# Patient Record
Sex: Female | Born: 1960 | Race: White | Hispanic: No | Marital: Single | Smoking: Current every day smoker
Health system: Southern US, Community
[De-identification: ages and names within clinical notes are randomized; demographics above are authoritative.]

## PROBLEM LIST (undated history)

## (undated) DIAGNOSIS — J189 Pneumonia, unspecified organism: Secondary | ICD-10-CM

---

## 2012-12-08 ENCOUNTER — Ambulatory Visit (INDEPENDENT_AMBULATORY_CARE_PROVIDER_SITE_OTHER): Payer: Self-pay | Admitting: Physician Assistant

## 2012-12-08 VITALS — BP 110/80 | HR 82 | Temp 98.1°F | Resp 16 | Ht 70.5 in | Wt 224.0 lb

## 2012-12-08 DIAGNOSIS — F988 Other specified behavioral and emotional disorders with onset usually occurring in childhood and adolescence: Secondary | ICD-10-CM

## 2012-12-08 NOTE — Progress Notes (Signed)
  Subjective:    Patient ID: Deanna Bates, female    DOB: 09-04-1960, 52 y.o.   MRN: [id number]  HPI 52 year old female presents for refill of Adderall 20 mg daily.  States she moved here from [place] 2.5 months ago but has not yet established care here.  Was seeing a psychiatrist and therapist after the death of her son.  Has only been on Adderall for the last 6 months since starting therapy.  States she called him because she was going to be out of her medications, and he sent prescriptions to be refilled but she was unable to get her Adderall because out of state prescriptions "need to be verified."  States she has been out for 1 1/2 weeks.      Review of Systems  Constitutional: Negative for fever.  Respiratory: Negative for chest tightness.   Neurological: Negative for dizziness.       Objective:   Physical Exam  Constitutional: She is oriented to person, place, and time. She appears well-developed and well-nourished.  HENT:  Head: Normocephalic and atraumatic.  Right Ear: External ear normal.  Left Ear: External ear normal.  Eyes: Conjunctivae are normal.  Neck: Normal range of motion.  Cardiovascular: Normal rate.   Pulmonary/Chest: Effort normal.  Neurological: She is alert and oriented to person, place, and time.  Psychiatric: She has a normal mood and affect. Her behavior is normal. Judgment and thought content normal.          Assessment & Plan:  ADD (attention deficit disorder)  Per [place] Controlled Substance Database she had Adderall 20 mg daily #30 filled on 12/02/12.  Spoke with pharmacist who verified this information.  Has also had all of her medication filled at another pharmacy in 09/2012.  Patient reports she did not receive prescription on 12/02/12 and is concerned about theft.  I encouraged her to consult the police and file a report if necessary. In the meantime I explained I would not be able to fill her Adderall at this time, although I would be more than happy  to refer her to a psychiatrist here in town. Patient understands.

## 2013-11-01 ENCOUNTER — Emergency Department (HOSPITAL_COMMUNITY)
Admission: EM | Admit: 2013-11-01 | Discharge: 2013-11-01 | Disposition: A | Discharge status: Home / Self Care | Payer: PRIVATE HEALTH INSURANCE | Attending: Emergency Medicine | Admitting: Emergency Medicine

## 2013-11-01 ENCOUNTER — Encounter (HOSPITAL_COMMUNITY): Payer: Self-pay | Admitting: Emergency Medicine

## 2013-11-01 ENCOUNTER — Emergency Department (HOSPITAL_COMMUNITY): Payer: PRIVATE HEALTH INSURANCE

## 2013-11-01 DIAGNOSIS — F17200 Nicotine dependence, unspecified, uncomplicated: Secondary | ICD-10-CM | POA: Insufficient documentation

## 2013-11-01 DIAGNOSIS — J040 Acute laryngitis: Secondary | ICD-10-CM | POA: Insufficient documentation

## 2013-11-01 DIAGNOSIS — R0602 Shortness of breath: Secondary | ICD-10-CM | POA: Insufficient documentation

## 2013-11-01 DIAGNOSIS — R079 Chest pain, unspecified: Secondary | ICD-10-CM

## 2013-11-01 DIAGNOSIS — Z7982 Long term (current) use of aspirin: Secondary | ICD-10-CM | POA: Insufficient documentation

## 2013-11-01 DIAGNOSIS — Z79899 Other long term (current) drug therapy: Secondary | ICD-10-CM | POA: Insufficient documentation

## 2013-11-01 DIAGNOSIS — Z8701 Personal history of pneumonia (recurrent): Secondary | ICD-10-CM | POA: Insufficient documentation

## 2013-11-01 DIAGNOSIS — J40 Bronchitis, not specified as acute or chronic: Secondary | ICD-10-CM | POA: Insufficient documentation

## 2013-11-01 DIAGNOSIS — R0789 Other chest pain: Secondary | ICD-10-CM | POA: Insufficient documentation

## 2013-11-01 HISTORY — DX: Pneumonia, unspecified organism: J18.9

## 2013-11-01 LAB — BASIC METABOLIC PANEL
Anion gap: 18 — ABNORMAL HIGH (ref 5–15)
BUN: 8 mg/dL (ref 6–23)
CHLORIDE: 104 meq/L (ref 96–112)
CO2: 19 meq/L (ref 19–32)
Calcium: 9.3 mg/dL (ref 8.4–10.5)
Creatinine, Ser: 0.81 mg/dL (ref 0.50–1.10)
GFR calc Af Amer: >90 mL/min (ref >90)
GFR, EST NON AFRICAN AMERICAN: 83 mL/min — AB (ref >90)
GLUCOSE: 118 mg/dL — AB (ref 70–99)
POTASSIUM: 3.3 meq/L — AB (ref 3.7–5.3)
Sodium: 141 mEq/L (ref 137–147)

## 2013-11-01 LAB — CBC WITH DIFFERENTIAL/PLATELET
Basophils Absolute: 0 10*3/uL (ref 0.0–0.1)
Basophils Relative: 0 % (ref 0–1)
EOS ABS: 0.2 10*3/uL (ref 0.0–0.7)
Eosinophils Relative: 2 % (ref 0–5)
HCT: 38.4 % (ref 36.0–46.0)
Hemoglobin: 13.1 g/dL (ref 12.0–15.0)
Lymphocytes Relative: 21 % (ref 12–46)
Lymphs Abs: 2.2 10*3/uL (ref 0.7–4.0)
MCH: 30.5 pg (ref 26.0–34.0)
MCHC: 34.1 g/dL (ref 30.0–36.0)
MCV: 89.5 fL (ref 78.0–100.0)
Monocytes Absolute: 0.3 10*3/uL (ref 0.1–1.0)
Monocytes Relative: 3 % (ref 3–12)
NEUTROS ABS: 7.5 10*3/uL (ref 1.7–7.7)
NEUTROS PCT: 74 % (ref 43–77)
PLATELETS: 196 10*3/uL (ref 150–400)
RBC: 4.29 MIL/uL (ref 3.87–5.11)
RDW: 13.7 % (ref 11.5–15.5)
WBC: 10.1 10*3/uL (ref 4.0–10.5)

## 2013-11-01 LAB — D-DIMER, QUANTITATIVE (NOT AT ARMC): D DIMER QUANT: 0.42 ug{FEU}/mL (ref 0.00–0.48)

## 2013-11-01 LAB — TROPONIN I: Troponin I: <0.3 ng/mL (ref <0.30)

## 2013-11-01 MED ORDER — ALBUTEROL SULFATE HFA 108 (90 BASE) MCG/ACT IN AERS
2.0000 | INHALATION_SPRAY | RESPIRATORY_TRACT | Status: DC | PRN
Start: 2013-11-01 — End: 2013-11-02
  Administered 2013-11-01: 2 via RESPIRATORY_TRACT
  Filled 2013-11-01: qty 6.7

## 2013-11-01 MED ORDER — IBUPROFEN 600 MG PO TABS: 600.0000 mg | ORAL_TABLET | Freq: Four times a day (QID) | ORAL | Status: AC | PRN

## 2013-11-01 MED ORDER — PREDNISONE 20 MG PO TABS: 60.0000 mg | ORAL_TABLET | Freq: Every day | ORAL | Status: AC

## 2013-11-01 MED ORDER — HYDROCODONE-ACETAMINOPHEN 5-325 MG PO TABS
1.0000 | ORAL_TABLET | Freq: Once | ORAL | Status: AC
  Administered 2013-11-01: 1 via ORAL
  Filled 2013-11-01: qty 1

## 2013-11-01 MED ORDER — OXYCODONE-ACETAMINOPHEN 5-325 MG PO TABS: 1.0000 | ORAL_TABLET | Freq: Four times a day (QID) | ORAL | Status: AC | PRN

## 2013-11-01 MED ORDER — ASPIRIN 81 MG PO CHEW
324.0000 mg | CHEWABLE_TABLET | Freq: Once | ORAL | Status: AC
  Administered 2013-11-01: 324 mg via ORAL
  Filled 2013-11-01: qty 4

## 2013-11-01 MED ORDER — ONDANSETRON HCL 4 MG/2ML IJ SOLN
4.0000 mg | Freq: Once | INTRAMUSCULAR | Status: AC
  Administered 2013-11-01: 4 mg via INTRAVENOUS
  Filled 2013-11-01: qty 2

## 2013-11-01 MED ORDER — ALBUTEROL SULFATE HFA 108 (90 BASE) MCG/ACT IN AERS
2.0000 | INHALATION_SPRAY | RESPIRATORY_TRACT | Status: AC | PRN
Start: 2013-11-01 — End: ?

## 2013-11-01 MED ORDER — OXYCODONE-ACETAMINOPHEN 5-325 MG PO TABS
1.0000 | ORAL_TABLET | Freq: Once | ORAL | Status: AC
  Administered 2013-11-01: 1 via ORAL
  Filled 2013-11-01: qty 1

## 2013-11-01 MED ORDER — KETOROLAC TROMETHAMINE 30 MG/ML IJ SOLN
30.0000 mg | Freq: Once | INTRAMUSCULAR | Status: AC
  Administered 2013-11-01: 30 mg via INTRAVENOUS
  Filled 2013-11-01: qty 1

## 2013-11-01 NOTE — ED Notes (Signed)
Per EMS- recent coughing with SOB for multiple days. Lost voice yesterday. Worsened today. [place] and developed SOB with audible wheezing. Also c/o left shoulder pain. 12 Lead unremarkable. Given 5mg  Albuterol. Has had 10 mg Albuterol and 1 mg Atrovent after this.  Given Solu-Medrol 125 mg IV. SpO2 98-100% on treatments. VS: BP 122/66 HR 86 RR 20. Hx recent PNA. Ambulatory.

## 2013-11-01 NOTE — ED Notes (Signed)
Bed: [id number] Expected date:  Expected time:  Means of arrival:  Comments: 53 y/o F, resp distress

## 2013-11-01 NOTE — ED Notes (Signed)
Initial contact-pt A&Ox4. Audible wheezing noted with dyspnea at rest and upon exertion. Completing breathing tx at this time-respiratory at bedside. Patient's account of symptoms aligns with EMS report. Per patient-recently treated for PNA (ongoing for past 2 months). Completed two abx regimens of Augmentin. States, "This feels very different. I literally couldn't breathe today and it came on all of the sudden." Smokes 0.5 ppd. Reports hx DVT. Denies hemoptysis but endorses green sputum. C/o 10/10 HA that started with increased WOB. In apparent distress. Awaiting MD.

## 2013-11-01 NOTE — ED Notes (Signed)
Pt complaining of return of chest tightness, pt worries that said feeling matches the feeling that she had at the onset of today's symptoms. Horton, MD, made aware. Horton, MD, requests EKG.

## 2013-11-01 NOTE — ED Provider Notes (Signed)
CSN: [id number]     Arrival date & time 11/01/13  1423 History   First MD Initiated Contact with Patient 11/01/13 1451     Chief Complaint  Patient presents with  . Shortness of Breath     (Consider location/radiation/quality/duration/timing/severity/associated sxs/prior Treatment) HPI  This is a 53 year old female current smoker who presents with shortness of breath. Patient reports progressive shortness of breath over the last several days. She states her shortness of breath got acutely worse earlier today while at a convention.  Recent history of URI symptoms including chills, decreased urination, and cough productive of green sputum. No objective fevers measured at home. Patient states with her shortness of breath she also had tightness over her chest. She denies tightness at this time. She was given steroids and nebs in EMS.  She states that she fell she got better but now is feeling better. She also reports a history of DVT.  Past Medical History  Diagnosis Date  . Pneumonia    History reviewed. No pertinent past surgical history. History reviewed. No pertinent family history. History  Substance Use Topics  . Smoking status: Current Every Day Smoker -- 0.50 packs/day    Types: Cigarettes  . Smokeless tobacco: Never Used  . Alcohol Use: No   OB History   Grav Para Term Preterm Abortions TAB SAB Ect Mult Living                 Review of Systems  Constitutional: Positive for chills. Negative for fever.  HENT: Negative for sore throat.   Respiratory: Positive for cough, chest tightness and shortness of breath.   Cardiovascular: Positive for chest pain. Negative for leg swelling.  Gastrointestinal: Negative for nausea, vomiting and abdominal pain.  Genitourinary: Negative for dysuria.  Musculoskeletal: Negative for back pain.  Skin: Negative for rash.  Neurological: Negative for headaches.  All other systems reviewed and are negative.     Allergies  Review of patient's  allergies indicates no known allergies.  Home Medications   Prior to Admission medications   Medication Sig Start Date End Date Taking? Authorizing Provider  amphetamine-dextroamphetamine (ADDERALL) 20 MG tablet Take 20 mg by mouth 2 (two) times daily.    Yes Historical Provider, MD  aspirin 325 MG tablet Take 975 mg by mouth daily.   Yes Historical Provider, MD  atorvastatin (LIPITOR) 10 MG tablet Take 10 mg by mouth daily.   Yes Historical Provider, MD  clonazePAM (KLONOPIN) 1 MG tablet Take 1 mg by mouth 3 (three) times daily as needed for anxiety.    Yes Historical Provider, MD  mirtazapine (REMERON) 30 MG tablet Take 60 mg by mouth at bedtime.    Yes Historical Provider, MD  triamcinolone (NASACORT ALLERGY 24HR) 55 MCG/ACT AERO nasal inhaler Place 1 spray into the nose daily.   Yes Historical Provider, MD  zolpidem (AMBIEN CR) 12.5 MG CR tablet Take 12.5 mg by mouth at bedtime as needed for sleep.   Yes Historical Provider, MD  albuterol (PROVENTIL HFA;VENTOLIN HFA) 108 (90 BASE) MCG/ACT inhaler Inhale 2 puffs into the lungs every 4 (four) hours as needed for wheezing or shortness of breath. 11/01/13   Shon Baton, MD  ibuprofen (ADVIL,MOTRIN) 600 MG tablet Take 1 tablet (600 mg total) by mouth every 6 (six) hours as needed. Limit to 3-5 days 11/01/13   Shon Baton, MD  oxyCODONE-acetaminophen (PERCOCET/ROXICET) 5-325 MG per tablet Take 1 tablet by mouth every 6 (six) hours as needed for moderate pain  or severe pain. 11/01/13   Shon Batonourtney F Horton, MD  predniSONE (DELTASONE) 20 MG tablet Take 3 tablets (60 mg total) by mouth daily with breakfast. 11/01/13   Shon Batonourtney F Horton, MD   BP 131/76  Pulse 86  Temp(Src) 98.4 F (36.9 C) (Oral)  Resp 18  SpO2 100% Physical Exam  Nursing note and vitals reviewed. Constitutional: She is oriented to person, place, and time. She appears well-developed and well-nourished. No distress.  Decreased phonation  HENT:  Head: Normocephalic and  atraumatic.  Mouth/Throat: Oropharynx is clear and moist.  Eyes: Pupils are equal, round, and reactive to light.  Neck: Normal range of motion. Neck supple.  Cardiovascular: Normal rate, regular rhythm and normal heart sounds.   No murmur heard. Pulmonary/Chest: Effort normal and breath sounds normal. No stridor. No respiratory distress. She has no wheezes.  Abdominal: Soft. Bowel sounds are normal. There is no tenderness. There is no rebound.  Musculoskeletal: She exhibits no edema.  No calf tenderness  Lymphadenopathy:    She has no cervical adenopathy.  Neurological: She is alert and oriented to person, place, and time.  Skin: Skin is warm and dry.  Psychiatric: She has a normal mood and affect.    ED Course  Procedures (including critical care time) Labs Review Labs Reviewed  BASIC METABOLIC PANEL - Abnormal; Notable for the following:    Potassium 3.3 (*)    Glucose, Bld 118 (*)    GFR calc non Af Amer 83 (*)    Anion gap 18 (*)    All other components within normal limits  CBC WITH DIFFERENTIAL  TROPONIN I  D-DIMER, QUANTITATIVE  TROPONIN I    Imaging Review Dg Chest 2 View  11/01/2013   CLINICAL DATA:  Shortness of breath, history of pneumonia, smoker  EXAM: CHEST  2 VIEW  COMPARISON:  None.  FINDINGS: The heart size and vascular pattern are normal. There is no consolidation effusion or pneumothorax. There are mild very well-defined linear markings in the left mid to lower lung zone. These appear most consistent with scarring.  IMPRESSION: No active cardiopulmonary disease.   Electronically Signed   By: Esperanza Heiraymond  Rubner M.D.   On: 11/01/2013 16:19     EKG Interpretation   Date/Time:  Thursday November 01 2013 17:34:56 EDT Ventricular Rate:  83 PR Interval:  139 QRS Duration: 98 QT Interval:  410 QTC Calculation: 482 R Axis:   8 Text Interpretation:  Sinus rhythm No change from prior Confirmed by  HORTON  MD, COURTNEY ([id number]) on 11/01/2013 8:42:03 PM      MDM    Final diagnoses:  Bronchitis  Laryngitis  Chest pain, unspecified chest pain type    Patient presents with several days of URI symptoms including cough and shortness of breath. Also with laryngitis. She reports acute worsening of shortness of breath today associated with anterior chest pain. She's currently symptom-free. No wheezing on my exam. However, EMS appreciated wheezing and treated with DuoNeb and steroids. EKG is normal. Troponin is negative. Chest x-ray shows no evidence of pneumonia. Patient does have a history of DVT but is otherwise low risk for Wells criteria. Dimer is negative.  Patient given full dose aspirin and Toradol.  17:20 Patient with recurrence of shortness of breath and chest pain. On evaluation reports anterior tightness it radiates to her back. No wheezing on exam. Repeat EKG is stable without dynamic changes.  Delta troponin negative.   Suspect with recent URI symptoms and laryngitis, the patient symptoms  are likely secondary to bronchitis and chest wall inflammation.  Patient was given pain medication. Discussed with patient my suspicions. She has a heart score of 2. We'll have her followup with cardiology. She's also to followup with her primary care physician. She will be discharged home with albuterol inhaler and steroids as well as pain medication.  After history, exam, and medical workup I feel the patient has been appropriately medically screened and is safe for discharge home. Pertinent diagnoses were discussed with the patient. Patient was given return precautions.   Shon Baton, MD 11/01/13 [phone]

## 2013-11-01 NOTE — Discharge Instructions (Signed)
Chest Pain (Nonspecific) °It is often hard to give a specific diagnosis for the cause of chest pain. There is always a chance that your pain could be related to something serious, such as a heart attack or a blood clot in the lungs. You need to follow up with your health care provider for further evaluation. °CAUSES  °· Heartburn. °· Pneumonia or bronchitis. °· Anxiety or stress. °· Inflammation around your heart (pericarditis) or lung (pleuritis or pleurisy). °· A blood clot in the lung. °· A collapsed lung (pneumothorax). It can develop suddenly on its own (spontaneous pneumothorax) or from trauma to the chest. °· Shingles infection (herpes zoster virus). °The chest wall is composed of bones, muscles, and cartilage. Any of these can be the source of the pain. °· The bones can be bruised by injury. °· The muscles or cartilage can be strained by coughing or overwork. °· The cartilage can be affected by inflammation and become sore (costochondritis). °DIAGNOSIS  °Lab tests or other studies may be needed to find the cause of your pain. Your health care provider may have you take a test called an ambulatory electrocardiogram (ECG). An ECG records your heartbeat patterns over a 24-hour period. You may also have other tests, such as: °· Transthoracic echocardiogram (TTE). During echocardiography, sound waves are used to evaluate how blood flows through your heart. °· Transesophageal echocardiogram (TEE). °· Cardiac monitoring. This allows your health care provider to monitor your heart rate and rhythm in real time. °· Holter monitor. This is a portable device that records your heartbeat and can help diagnose heart arrhythmias. It allows your health care provider to track your heart activity for several days, if needed. °· Stress tests by exercise or by giving medicine that makes the heart beat faster. °TREATMENT  °· Treatment depends on what may be causing your chest pain. Treatment may include: °· Acid blockers for  heartburn. °· Anti-inflammatory medicine. °· Pain medicine for inflammatory conditions. °· Antibiotics if an infection is present. °· You may be advised to change lifestyle habits. This includes stopping smoking and avoiding alcohol, caffeine, and chocolate. °· You may be advised to keep your head raised (elevated) when sleeping. This reduces the chance of acid going backward from your stomach into your esophagus. °Most of the time, nonspecific chest pain will improve within 2-3 days with rest and mild pain medicine.  °HOME CARE INSTRUCTIONS  °· If antibiotics were prescribed, take them as directed. Finish them even if you start to feel better. °· For the next few days, avoid physical activities that bring on chest pain. Continue physical activities as directed. °· Do not use any tobacco products, including cigarettes, chewing tobacco, or electronic cigarettes. °· Avoid drinking alcohol. °· Only take medicine as directed by your health care provider. °· Follow your health care provider's suggestions for further testing if your chest pain does not go away. °· Keep any follow-up appointments you made. If you do not go to an appointment, you could develop lasting (chronic) problems with pain. If there is any problem keeping an appointment, call to reschedule. °SEEK MEDICAL CARE IF:  °· Your chest pain does not go away, even after treatment. °· You have a rash with blisters on your chest. °· You have a fever. °SEEK IMMEDIATE MEDICAL CARE IF:  °· You have increased chest pain or pain that spreads to your arm, neck, jaw, back, or abdomen. °· You have shortness of breath. °· You have an increasing cough, or you cough   up blood. °· You have severe back or abdominal pain. °· You feel nauseous or vomit. °· You have severe weakness. °· You faint. °· You have chills. °This is an emergency. Do not wait to see if the pain will go away. Get medical help at once. Call your local emergency services (911 in [place] Do not drive  yourself to the hospital. °MAKE SURE YOU:  °· Understand these instructions. °· Will watch your condition. °· Will get help right away if you are not doing well or get worse. °Document Released: 12/23/2004 Document Revised: 03/20/2013 Document Reviewed: 10/19/2007 °[employer]® Patient Information ©2015 [employer]. This information is not intended to replace advice given to you by your health care provider. Make sure you discuss any questions you have with your health care provider. °Acute Bronchitis °Bronchitis is inflammation of the airways that extend from the windpipe into the lungs (bronchi). The inflammation often causes mucus to develop. This leads to a cough, which is the most common symptom of bronchitis.  °In acute bronchitis, the condition usually develops suddenly and goes away over time, usually in a couple weeks. Smoking, allergies, and asthma can make bronchitis worse. Repeated episodes of bronchitis may cause further lung problems.  °CAUSES °Acute bronchitis is most often caused by the same virus that causes a cold. The virus can spread from person to person (contagious) through coughing, sneezing, and touching contaminated objects. °SIGNS AND SYMPTOMS  °· Cough.   °· Fever.   °· Coughing up mucus.   °· Body aches.   °· Chest congestion.   °· Chills.   °· Shortness of breath.   °· Sore throat.   °DIAGNOSIS  °Acute bronchitis is usually diagnosed through a physical exam. Your health care provider will also ask you questions about your medical history. Tests, such as chest X-rays, are sometimes done to rule out other conditions.  °TREATMENT  °Acute bronchitis usually goes away in a couple weeks. Oftentimes, no medical treatment is necessary. Medicines are sometimes given for relief of fever or cough. Antibiotic medicines are usually not needed but may be prescribed in certain situations. In some cases, an inhaler may be recommended to help reduce shortness of breath and control the cough. A cool mist  vaporizer may also be used to help thin bronchial secretions and make it easier to clear the chest.  °HOME CARE INSTRUCTIONS °· Get plenty of rest.   °· Drink enough fluids to keep your urine clear or pale yellow (unless you have a medical condition that requires fluid restriction). Increasing fluids may help thin your respiratory secretions (sputum) and reduce chest congestion, and it will prevent dehydration.   °· Take medicines only as directed by your health care provider. °· If you were prescribed an antibiotic medicine, finish it all even if you start to feel better. °· Avoid smoking and secondhand smoke. Exposure to cigarette smoke or irritating chemicals will make bronchitis worse. If you are a smoker, consider using nicotine gum or skin patches to help control withdrawal symptoms. Quitting smoking will help your lungs heal faster.   °· Reduce the chances of another bout of acute bronchitis by washing your hands frequently, avoiding people with cold symptoms, and trying not to touch your hands to your mouth, nose, or eyes.   °· Keep all follow-up visits as directed by your health care provider.   °SEEK MEDICAL CARE IF: °Your symptoms do not improve after 1 week of treatment.  °SEEK IMMEDIATE MEDICAL CARE IF: °· You develop an increased fever or chills.   °· You have chest pain.   °·   You have severe shortness of breath. °· You have bloody sputum.   °· You develop dehydration. °· You faint or repeatedly feel like you are going to pass out. °· You develop repeated vomiting. °· You develop a severe headache. °MAKE SURE YOU:  °· Understand these instructions. °· Will watch your condition. °· Will get help right away if you are not doing well or get worse. °Document Released: 04/22/2004 Document Revised: 07/30/2013 Document Reviewed: 09/05/2012 °[employer]® Patient Information ©2015 [employer]. This information is not intended to replace advice given to you by your health care provider. Make sure you discuss any  questions you have with your health care provider. ° °

## 2015-12-20 IMAGING — CR DG CHEST 2V
2 series · 2 of 2 positions shown · non-contrast
Comparison: None.

CLINICAL DATA: Shortness of breath, history of pneumonia, smoker

EXAM:
CHEST  2 VIEW

[w chest pa]
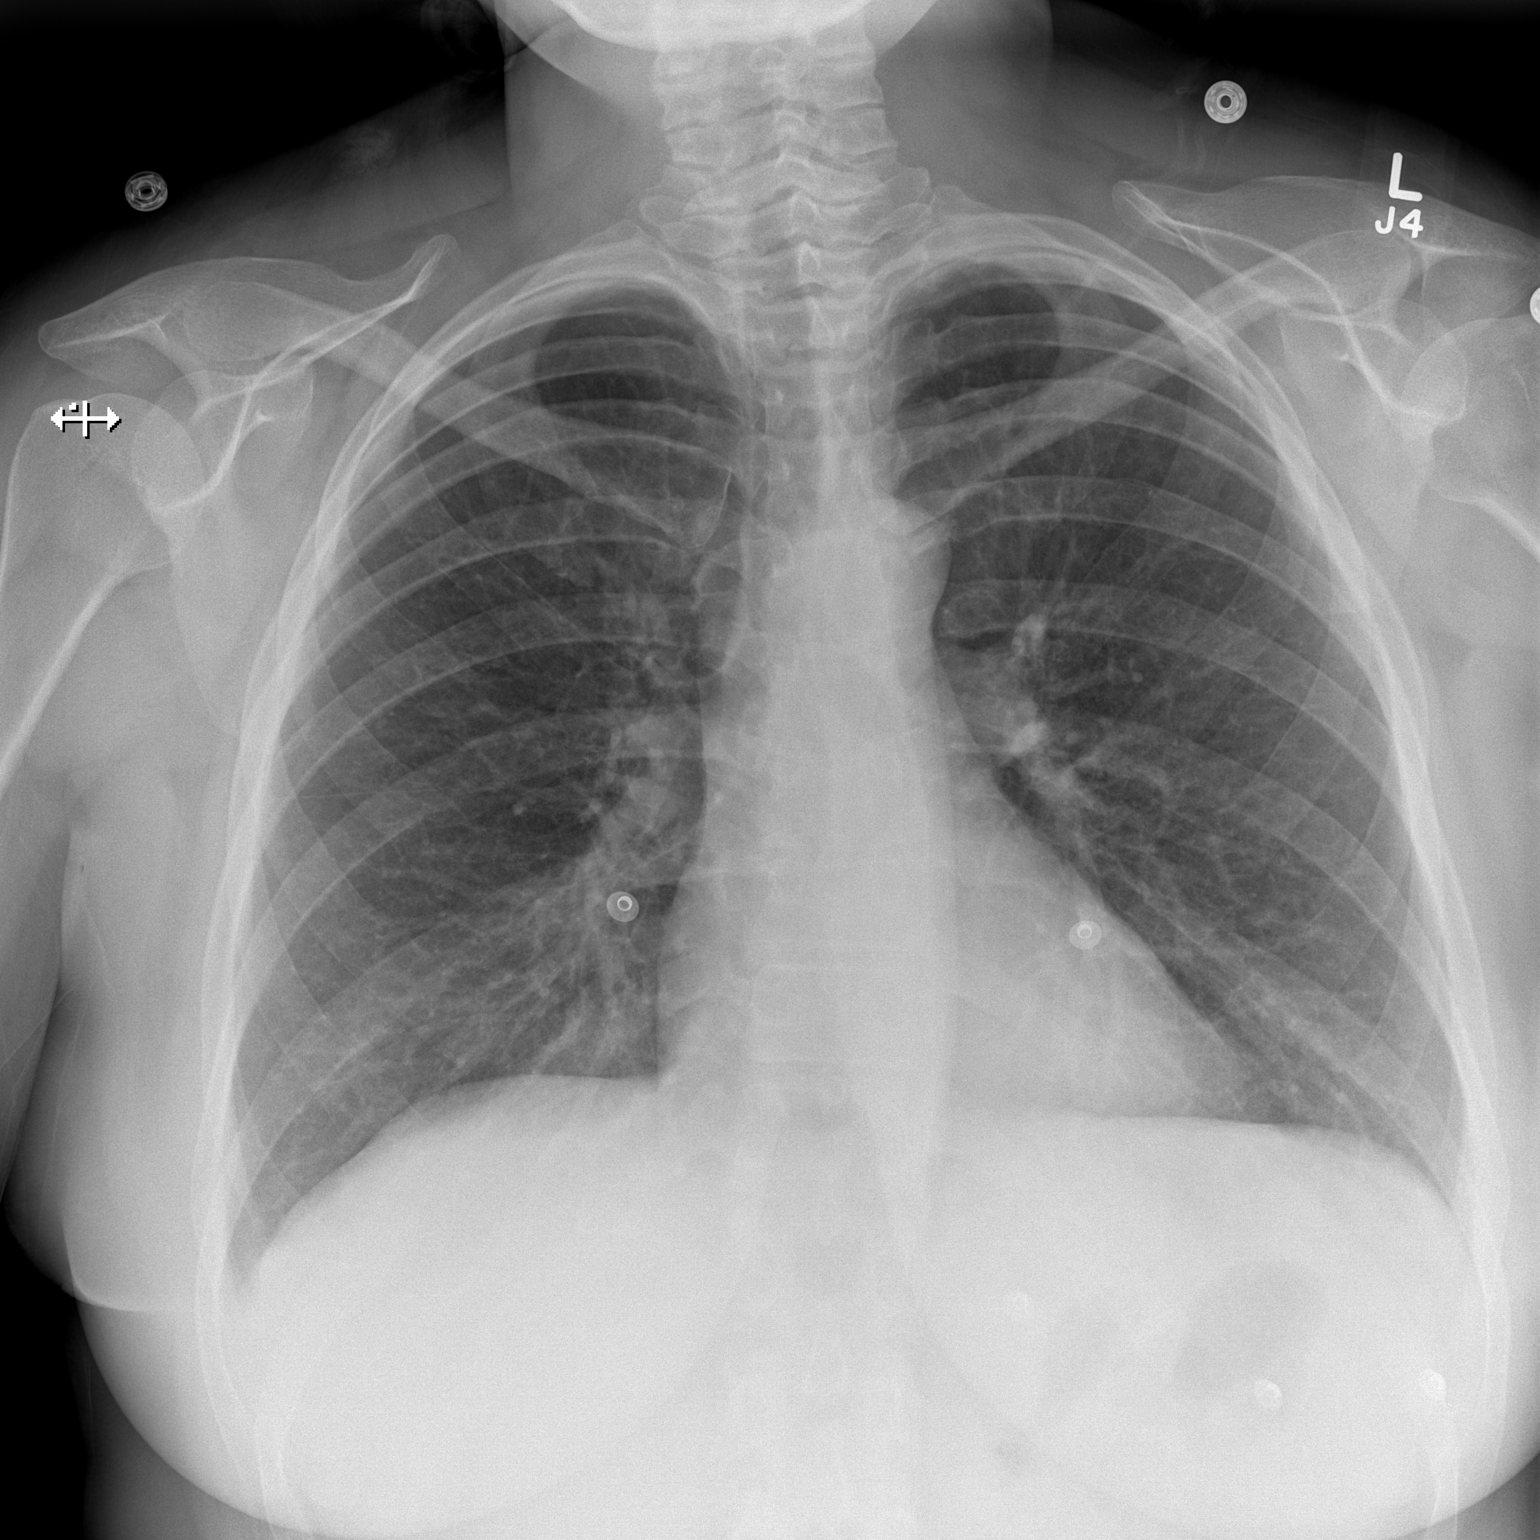

[w chest lat]
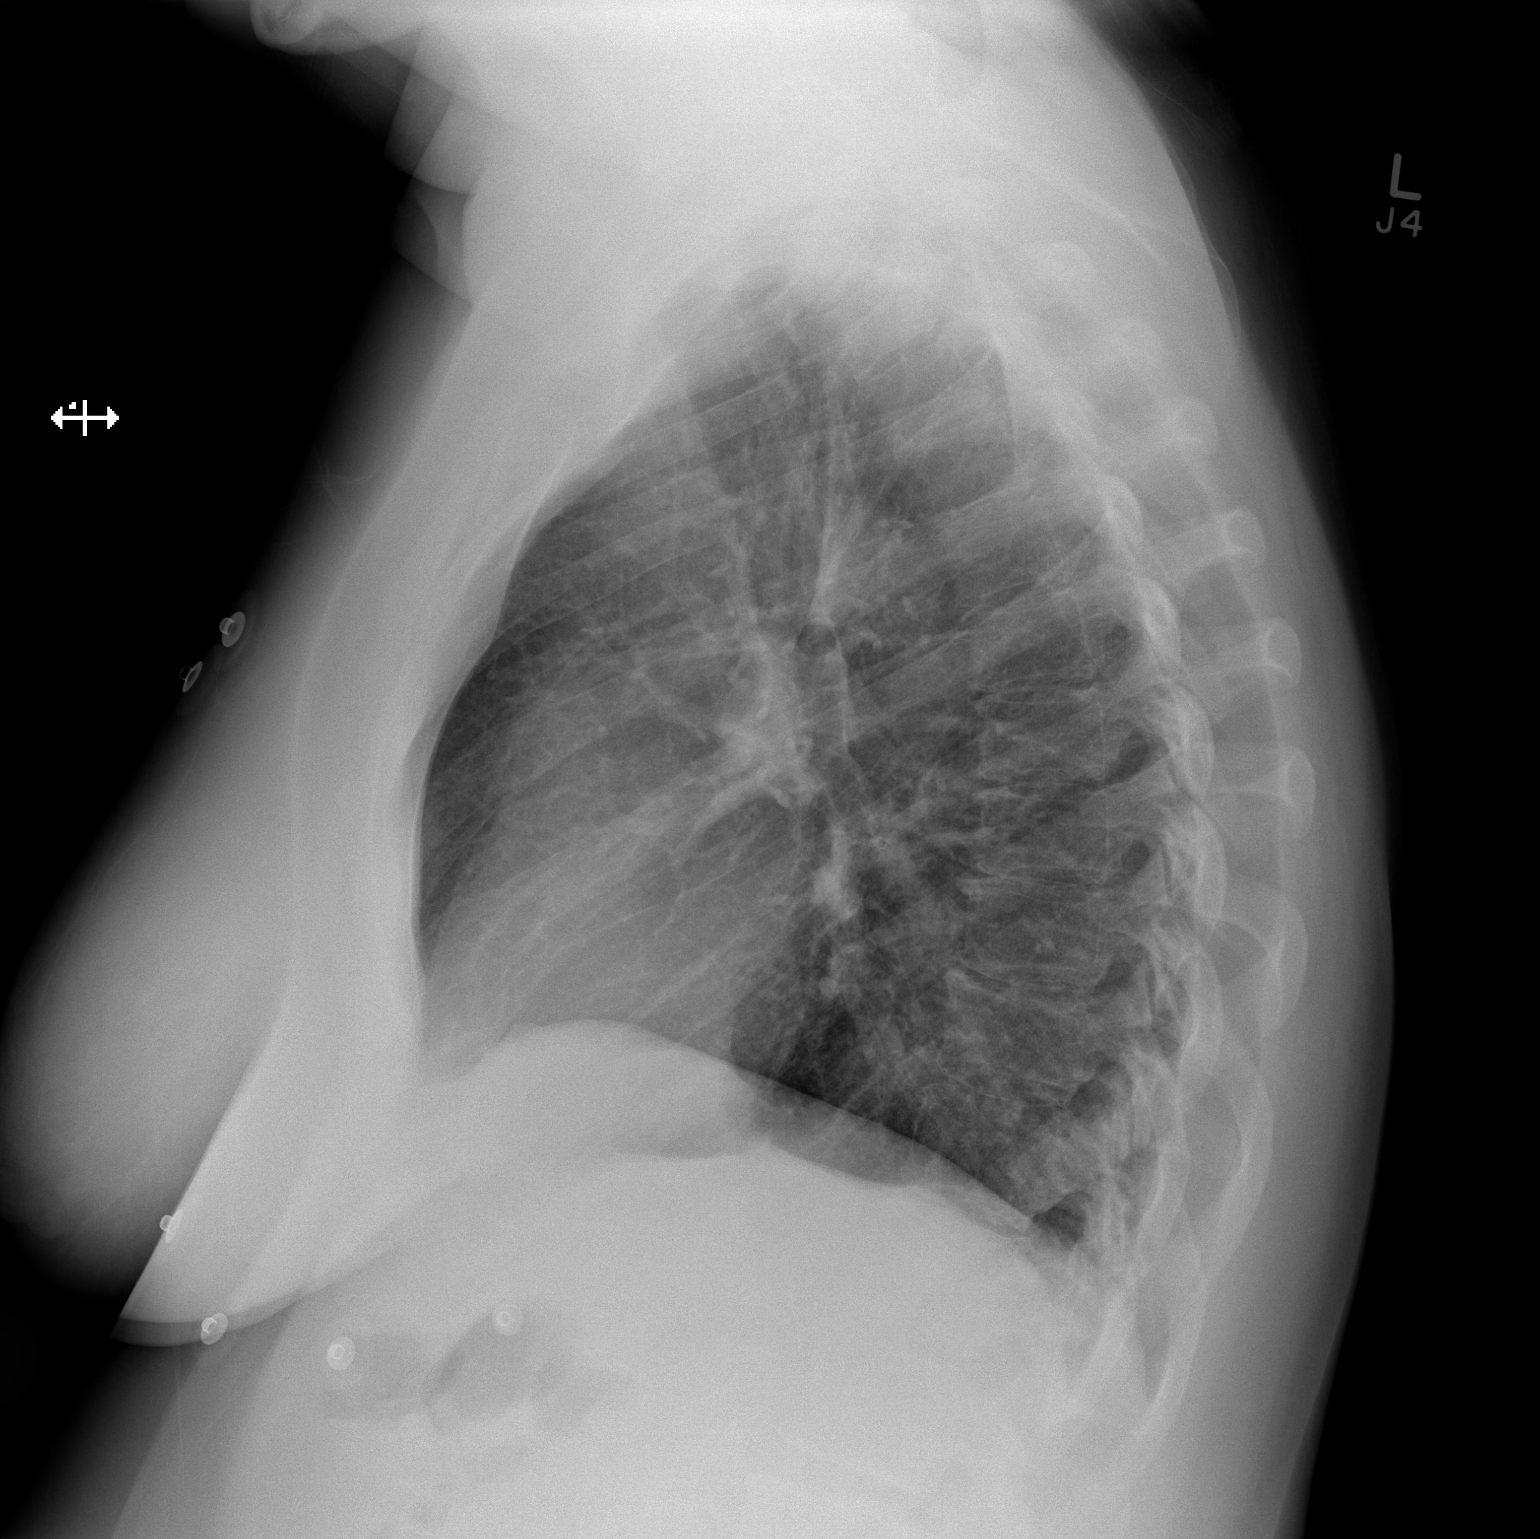

[2 of 2 positions shown; findings below may reference images not displayed]

FINDINGS: The heart size and vascular pattern are normal. There is no
consolidation effusion or pneumothorax. There are mild very
well-defined linear markings in the left mid to lower lung zone.
These appear most consistent with scarring.
IMPRESSION: No active cardiopulmonary disease.
